# Patient Record
Sex: Male | Born: 1980 | Race: White | Hispanic: No | Marital: Single | Smoking: Former smoker
Health system: Southern US, Community
[De-identification: ages and names within clinical notes are randomized; demographics above are authoritative.]

## PROBLEM LIST (undated history)

## (undated) DIAGNOSIS — F32A Depression, unspecified: Secondary | ICD-10-CM

## (undated) DIAGNOSIS — F419 Anxiety disorder, unspecified: Secondary | ICD-10-CM

## (undated) HISTORY — PX: FOOT FRACTURE SURGERY: SHX645

## (undated) HISTORY — DX: Depression, unspecified: F32.A

## (undated) HISTORY — DX: Anxiety disorder, unspecified: F41.9

---

## 2011-08-28 ENCOUNTER — Emergency Department: Payer: Self-pay | Admitting: Emergency Medicine

## 2011-08-31 ENCOUNTER — Ambulatory Visit: Payer: Self-pay | Admitting: Podiatry

## 2015-07-05 ENCOUNTER — Ambulatory Visit
Admission: EM | Admit: 2015-07-05 | Discharge: 2015-07-05 | Disposition: A | Discharge status: Home / Self Care | Payer: Self-pay | Attending: Family Medicine | Admitting: Family Medicine

## 2015-07-05 ENCOUNTER — Encounter: Payer: Self-pay | Admitting: Emergency Medicine

## 2015-07-05 DIAGNOSIS — Z029 Encounter for administrative examinations, unspecified: Secondary | ICD-10-CM

## 2015-07-05 DIAGNOSIS — Z024 Encounter for examination for driving license: Secondary | ICD-10-CM

## 2015-07-05 LAB — DEPT OF TRANSP DIPSTICK, URINE (ARMC ONLY)
GLUCOSE, UA: NEGATIVE mg/dL
HGB URINE DIPSTICK: NEGATIVE
PROTEIN: NEGATIVE mg/dL
SPECIFIC GRAVITY, URINE: 1.02 (ref 1.005–1.030)

## 2015-07-05 NOTE — ED Notes (Signed)
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  Patient was provided 2 cups of water and still was unable to void at this time.  I spoke to the patient's manager Sonya at [employer] [phone] to verify whether the patient needed a Urine Drug Screen at this visit.  Lamar Laundry stated that "I need for him to be on the road now and if he cannot void for the test, could he come back tomorrow morning for the test?"  She was informed that he could not void at this time after 2 cups of water.  Lamar Laundry stated "I am not concerned about the drug screen and I will need for him to come back tomorrow morning for his DOT Urine Drug Screen."  I notified the patient that Lamar Laundry has asked for him to come back tomorrow to [facility] in the morning for his DOT Urine Drug Screen.  Patient was also instructed to come with a full bladder and that we open at 7am.  Patient was given his DOT Certification paperwork at this time.  Patient verbalized understanding.

## 2015-07-05 NOTE — ED Notes (Signed)
Patient here for DOT Physical.  

## 2015-07-05 NOTE — ED Provider Notes (Signed)
CSN: [id number]     Arrival date & time 07/05/15  0845 History   First MD Initiated Contact with Patient 07/05/15 [phone]     Chief Complaint  Patient presents with  . DOT Physical    (Consider location/radiation/quality/duration/timing/severity/associated sxs/prior Treatment) HPI   34 year old male presents for a DOT physical. 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 He denies any past medical history significant. Surgical past history significant for ORIF of a right fifth metatarsal base fracture History reviewed. No pertinent past medical history. Past Surgical History  Procedure Laterality Date  . Foot fracture surgery Right    History reviewed. No pertinent family history. Social History  Substance Use Topics  . Smoking status: Former Games developer  . Smokeless tobacco: Never Used  . Alcohol Use: Yes    Review of Systems  Constitutional: Negative for chills, activity change and fatigue.  Respiratory: Negative for apnea, chest tightness and shortness of breath.   All other systems reviewed and are negative.   Allergies  Review of patient's allergies indicates no known allergies.  Home Medications   Prior to Admission medications   Not on File   Meds Ordered and Administered this Visit  Medications - No data to display  BP 135/73 mmHg  Pulse 93  Temp(Src) 97.4 F (36.3 C) (Tympanic)  Resp 16  Ht 5' 10.5" (1.791 m)  Wt 179 lb (81.194 kg)  BMI 25.31 kg/m2  SpO2 100% No data found.   Physical Exam  Constitutional:  Referred to DOT physical exam form  Nursing note and vitals reviewed.   ED Course  Procedures (including critical care time)  Labs Review Labs Reviewed  DEPT OF TRANSP DIPSTICK, URINE([facility] ONLY)    Imaging Review No results found.   Visual Acuity Review  Right Eye Distance: 20/20 uncorrected Left Eye Distance: 20/20 uncorrected Bilateral Distance: 20/15 uncorrected  Right Eye Near:    Left Eye Near:    Bilateral Near:         MDM   1. Driver's permit physical examination        Lutricia Feil, PA-C 07/05/15 1013

## 2015-07-06 NOTE — ED Notes (Signed)
Patient returned for DOT COC urine only. Obtained per protocol and specimen to lab with copies of COC to patient and for employer

## 2016-10-31 DIAGNOSIS — Z0000 Encounter for general adult medical examination without abnormal findings: Secondary | ICD-10-CM | POA: Diagnosis not present

## 2017-10-11 ENCOUNTER — Encounter: Payer: Self-pay | Admitting: Emergency Medicine

## 2017-10-11 ENCOUNTER — Emergency Department
Admission: EM | Admit: 2017-10-11 | Discharge: 2017-10-11 | Disposition: A | Discharge status: Home / Self Care | Payer: 59 | Attending: Emergency Medicine | Admitting: Emergency Medicine

## 2017-10-11 ENCOUNTER — Other Ambulatory Visit: Payer: Self-pay

## 2017-10-11 DIAGNOSIS — R55 Syncope and collapse: Secondary | ICD-10-CM | POA: Diagnosis not present

## 2017-10-11 DIAGNOSIS — Z87891 Personal history of nicotine dependence: Secondary | ICD-10-CM | POA: Insufficient documentation

## 2017-10-11 LAB — BASIC METABOLIC PANEL
ANION GAP: 9 (ref 5–15)
BUN: 12 mg/dL (ref 6–20)
CHLORIDE: 101 mmol/L (ref 101–111)
CO2: 27 mmol/L (ref 22–32)
Calcium: 9.4 mg/dL (ref 8.9–10.3)
Creatinine, Ser: 0.93 mg/dL (ref 0.61–1.24)
GFR calc Af Amer: >60 mL/min (ref >60)
GFR calc non Af Amer: >60 mL/min (ref >60)
GLUCOSE: 117 mg/dL — AB (ref 65–99)
POTASSIUM: 3.4 mmol/L — AB (ref 3.5–5.1)
Sodium: 137 mmol/L (ref 135–145)

## 2017-10-11 LAB — CBC
HEMATOCRIT: 43.4 % (ref 40.0–52.0)
HEMOGLOBIN: 14.5 g/dL (ref 13.0–18.0)
MCH: 29.5 pg (ref 26.0–34.0)
MCHC: 33.5 g/dL (ref 32.0–36.0)
MCV: 88 fL (ref 80.0–100.0)
Platelets: 151 10*3/uL (ref 150–440)
RBC: 4.93 MIL/uL (ref 4.40–5.90)
RDW: 13.5 % (ref 11.5–14.5)
WBC: 5.4 10*3/uL (ref 3.8–10.6)

## 2017-10-11 LAB — GLUCOSE, CAPILLARY: Glucose-Capillary: 121 mg/dL — ABNORMAL HIGH (ref 65–99)

## 2017-10-11 NOTE — ED Triage Notes (Signed)
Pt arrives from visiting mother in hospital for LOC. Pt denies hitting head or LOC. Floor checked CBG which was 108 approximately 5 minutes ago. Pt denies use of anticoagulants and is otherwise in NAD.

## 2017-10-11 NOTE — ED Notes (Signed)
Pt in NAD at time of d/c, pt verbalizes d/c understanding and follow up. Pt with family, ambulatory .

## 2017-10-11 NOTE — ED Provider Notes (Signed)
[facility] Emergency Department Provider Note       Time seen: ----------------------------------------- 9:32 PM on 10/11/2017 -----------------------------------------   I have reviewed the triage vital signs and the nursing notes.  HISTORY   Chief Complaint Loss of Consciousness    HPI Brandon Yu is a 36 y.o. male with a history of foot surgery but otherwise healthy who presents to the ED for syncope.  Patient was in the hospital visiting his mother after she had surgery on her leg.  He states he was talking to his mother when he felt dizzy and passed out.  He denies hitting his head, states he sat down forcefully but otherwise has no complaints at this time.  He denies fevers, chills, chest pain, shortness of breath, palpitations, vomiting or diarrhea.  History reviewed. No pertinent past medical history.  There are no active problems to display for this patient.   Past Surgical History:  Procedure Laterality Date  . FOOT FRACTURE SURGERY Right     Allergies Patient has no known allergies.  Social History Social History   Tobacco Use  . Smoking status: Former Games developermoker  . Smokeless tobacco: Never Used  Substance Use Topics  . Alcohol use: Yes  . Drug use: No    Review of Systems Constitutional: Negative for fever. Eyes: Negative for vision changes ENT:  Negative for congestion, sore throat Cardiovascular: Negative for chest pain. Respiratory: Negative for shortness of breath. Gastrointestinal: Negative for abdominal pain, vomiting and diarrhea. Musculoskeletal: Negative for back pain. Skin: Negative for rash. Neurological: Negative for headaches, focal weakness or numbness.  All systems negative/normal/unremarkable except as stated in the HPI  ____________________________________________   PHYSICAL EXAM:  VITAL SIGNS: ED Triage Vitals [10/11/17 2109]  Enc Vitals Group     BP 123/64     Pulse Rate 72     Resp 16     Temp 98.6 F (37 C)     Temp Source Oral     SpO2 100 %     Weight 175 lb (79.4 kg)     Height 6' (1.829 m)     Head Circumference      Peak Flow      Pain Score      Pain Loc      Pain Edu?      Excl. in GC?     Constitutional: Alert and oriented. Well appearing and in no distress. Eyes: Conjunctivae are normal. Normal extraocular movements. ENT   Head: Normocephalic and atraumatic.   Nose: No congestion/rhinnorhea.   Mouth/Throat: Mucous membranes are moist.   Neck: No stridor. Cardiovascular: Normal rate, regular rhythm. No murmurs, rubs, or gallops. Respiratory: Normal respiratory effort without tachypnea nor retractions. Breath sounds are clear and equal bilaterally. No wheezes/rales/rhonchi. Gastrointestinal: Soft and nontender. Normal bowel sounds Musculoskeletal: Nontender with normal range of motion in extremities. No lower extremity tenderness nor edema. Neurologic:  Normal speech and language. No gross focal neurologic deficits are appreciated.  Skin:  Skin is warm, dry and intact. No rash noted. Psychiatric: Mood and affect are normal. Speech and behavior are normal.  ____________________________________________  EKG: Interpreted by me.  Sinus rhythm the rate is 66 bpm, normal PR interval, normal QRS, normal QT.  ____________________________________________  ED COURSE:  As part of my medical decision making, I reviewed the following data within the electronic MEDICAL RECORD NUMBER History obtained from family if available, nursing notes, old chart and ekg, as well as notes from prior ED visits. Patient  presented for syncope or near syncope, we will assess with labs as indicated   Procedures ____________________________________________   LABS (pertinent positives/negatives)  Labs Reviewed  BASIC METABOLIC PANEL - Abnormal; Notable for the following components:      Result Value   Potassium 3.4 (*)    Glucose, Bld 117 (*)    All other components  within normal limits  GLUCOSE, CAPILLARY - Abnormal; Notable for the following components:   Glucose-Capillary 121 (*)    All other components within normal limits  CBC  ____________________________________________  DIFFERENTIAL DIAGNOSIS   Vasovagal syncope, dehydration, electrolyte abnormality, arrhythmia  FINAL ASSESSMENT AND PLAN  Syncope   Plan: Patient had presented for syncope while visiting his mother in the hospital. Patient's labs are unremarkable and he is without complaint at this time.  He did not require any imaging today.  Overall he appears well and stable for outpatient follow-up.   Emily FilbertWilliams, Nichoel Digiulio E, MD   Note: This note was generated in part or whole with voice recognition software. Voice recognition is usually quite accurate but there are transcription errors that can and very often do occur. I apologize for any typographical errors that were not detected and corrected.     Emily FilbertWilliams, Danessa Mensch E, MD 10/11/17 2202

## 2017-10-11 NOTE — ED Triage Notes (Signed)
Pt reports he passed out while he was eating reports he was talking to his mother and felt dizzy and pass out. Pt reports he was visiting his mother in the hospital.  Pt denies hitting his head pt talks in complete sentences denies any other symptoms.

## 2018-08-18 DIAGNOSIS — H11152 Pinguecula, left eye: Secondary | ICD-10-CM | POA: Diagnosis not present

## 2018-08-25 DIAGNOSIS — R6889 Other general symptoms and signs: Secondary | ICD-10-CM | POA: Diagnosis not present

## 2018-08-25 DIAGNOSIS — H1032 Unspecified acute conjunctivitis, left eye: Secondary | ICD-10-CM | POA: Diagnosis not present

## 2018-08-25 DIAGNOSIS — J0100 Acute maxillary sinusitis, unspecified: Secondary | ICD-10-CM | POA: Diagnosis not present

## 2018-09-01 DIAGNOSIS — B349 Viral infection, unspecified: Secondary | ICD-10-CM | POA: Diagnosis not present

## 2020-02-22 ENCOUNTER — Encounter: Payer: Self-pay | Admitting: Family Medicine

## 2020-02-23 ENCOUNTER — Encounter: Payer: Self-pay | Admitting: Family Medicine

## 2020-06-13 ENCOUNTER — Ambulatory Visit: Payer: Self-pay | Attending: Internal Medicine

## 2020-06-13 DIAGNOSIS — Z23 Encounter for immunization: Secondary | ICD-10-CM

## 2020-06-13 NOTE — Progress Notes (Signed)
   Covid-19 Vaccination Clinic  Name:  Brandon Yu    MRN: [id number] DOB: September 02, 1981  06/13/2020  [name] was observed post Covid-19 immunization for 15 minutes without incident. He was provided with Vaccine Information Sheet and instruction to access the V-Safe system.   [name] was instructed to call 911 with any severe reactions post vaccine: Marland Kitchen Difficulty breathing  . Swelling of face and throat  . A fast heartbeat  . A bad rash all over body  . Dizziness and weakness   Immunizations Administered    Name Date Dose VIS Date Route   Moderna COVID-19 Vaccine 06/13/2020  9:22 AM 0.5 mL 09/2019 Intramuscular   Manufacturer: Moderna   Lot: [id number]   NDC: 07680-881-10

## 2020-07-11 ENCOUNTER — Ambulatory Visit: Payer: Self-pay

## 2020-07-11 ENCOUNTER — Ambulatory Visit: Payer: Self-pay | Attending: Critical Care Medicine

## 2020-07-11 DIAGNOSIS — Z23 Encounter for immunization: Secondary | ICD-10-CM

## 2020-07-11 NOTE — Progress Notes (Signed)
   Covid-19 Vaccination Clinic  Name:  Brandon Yu    MRN: [id number] DOB: 25-Aug-1981  07/11/2020  Mr. Woolsey was observed post Covid-19 immunization for 15 minutes without incident. He was provided with Vaccine Information Sheet and instruction to access the V-Safe system.   Mr. Gell was instructed to call 911 with any severe reactions post vaccine: Marland Kitchen Difficulty breathing  . Swelling of face and throat  . A fast heartbeat  . A bad rash all over body  . Dizziness and weakness   Immunizations Administered    Name Date Dose VIS Date Route   Moderna COVID-19 Vaccine 07/11/2020  9:49 AM 0.5 mL 09/2019 Intramuscular   Manufacturer: Gala Murdoch   Lot: [id number]   NDC: 71062-694-85

## 2021-01-26 ENCOUNTER — Other Ambulatory Visit: Payer: Self-pay

## 2021-01-26 ENCOUNTER — Ambulatory Visit (INDEPENDENT_AMBULATORY_CARE_PROVIDER_SITE_OTHER): Payer: BC Managed Care – PPO | Admitting: Podiatry

## 2021-01-26 ENCOUNTER — Encounter: Payer: Self-pay | Admitting: Podiatry

## 2021-01-26 DIAGNOSIS — L989 Disorder of the skin and subcutaneous tissue, unspecified: Secondary | ICD-10-CM | POA: Diagnosis not present

## 2021-01-26 NOTE — Progress Notes (Signed)
  Subjective:  Patient ID: Brandon Yu, male    DOB: 09-12-1981,  MRN: [id number]  Chief Complaint  Patient presents with  . Callouses    Patient presents today for painful callous lesion bottom of right forefoot x 2-3 weeks  "it feel like Im stepping on a nail"    40 y.o. male presents with the above complaint.  Patient presents with complaint of right submetatarsal 4 benign skin lesion.  Patient states is painful to touch.  He states that it hurts with ambulation almost feels like stepping on a nail.  Is sharp shooting in nature.  He has not tried anything for it.  He would like to discuss treatment options for this he has not seen anyone else prior to seeing me.   Review of Systems: Negative except as noted in the HPI. Denies N/V/F/Ch.  No past medical history on file. No current outpatient medications on file.  Social History   Tobacco Use  Smoking Status Former Smoker  Smokeless Tobacco Never Used    No Known Allergies Objective:  There were no vitals filed for this visit. There is no height or weight on file to calculate BMI. Constitutional Well developed. Well nourished.  Vascular Dorsalis pedis pulses palpable bilaterally. Posterior tibial pulses palpable bilaterally. Capillary refill normal to all digits.  No cyanosis or clubbing noted. Pedal hair growth normal.  Neurologic Normal speech. Oriented to person, place, and time. Epicritic sensation to light touch grossly present bilaterally.  Dermatologic  hyperkeratotic lesion with central nucleated core noted to the right submetatarsal 4.  Pain on palpation to the skin lesion.  No pinpoint bleeding noted.  Orthopedic: Normal joint ROM without pain or crepitus bilaterally. No visible deformities. No bony tenderness.   Radiographs: None Assessment:   1. Benign skin lesion    Plan:  Patient was evaluated and treated and all questions answered.  Right submetatarsal 4 benign skin  lesion/porokeratosis --Lesion was debrided today without complications. Hemostasis was achieved and the area was cleaned. Cantharone was applied followed by an occlusive bandage. Post procedure complications were discussed. Monitor for signs or symptoms of infection and directed to call the office mainly should any occur. -I discussed with the patient that he will benefit from aggressive debridement of the lesion followed by application of Cantharone therapy to help destroy the lesion.  Patient states understanding and would like to proceed with Cantharone therapy.  I discussed with him the generally takes about 3 application.  He states understanding  No follow-ups on file.

## 2021-02-09 ENCOUNTER — Ambulatory Visit: Payer: BC Managed Care – PPO | Admitting: Podiatry

## 2022-01-29 ENCOUNTER — Ambulatory Visit
Admission: RE | Admit: 2022-01-29 | Discharge: 2022-01-29 | Disposition: A | Discharge status: Home / Self Care | Payer: BC Managed Care – PPO | Source: Ambulatory Visit | Attending: Internal Medicine | Admitting: Internal Medicine

## 2022-01-29 ENCOUNTER — Other Ambulatory Visit: Payer: Self-pay | Admitting: Internal Medicine

## 2022-01-29 DIAGNOSIS — R200 Anesthesia of skin: Secondary | ICD-10-CM

## 2022-01-29 DIAGNOSIS — M5010 Cervical disc disorder with radiculopathy, unspecified cervical region: Secondary | ICD-10-CM | POA: Diagnosis present

## 2022-02-05 DIAGNOSIS — M545 Low back pain, unspecified: Secondary | ICD-10-CM | POA: Insufficient documentation

## 2022-08-03 ENCOUNTER — Other Ambulatory Visit: Payer: Self-pay | Admitting: Orthopedic Surgery

## 2022-08-03 DIAGNOSIS — M79604 Pain in right leg: Secondary | ICD-10-CM

## 2022-08-03 DIAGNOSIS — M4807 Spinal stenosis, lumbosacral region: Secondary | ICD-10-CM

## 2022-12-25 ENCOUNTER — Other Ambulatory Visit: Payer: Self-pay | Admitting: Family Medicine

## 2022-12-25 DIAGNOSIS — M79604 Pain in right leg: Secondary | ICD-10-CM

## 2022-12-25 DIAGNOSIS — M4807 Spinal stenosis, lumbosacral region: Secondary | ICD-10-CM

## 2023-01-07 ENCOUNTER — Ambulatory Visit
Admission: RE | Admit: 2023-01-07 | Discharge: 2023-01-07 | Disposition: A | Discharge status: Home / Self Care | Payer: BC Managed Care – PPO | Source: Ambulatory Visit | Attending: Family Medicine | Admitting: Family Medicine

## 2023-01-07 DIAGNOSIS — M79604 Pain in right leg: Secondary | ICD-10-CM

## 2023-01-07 DIAGNOSIS — M4807 Spinal stenosis, lumbosacral region: Secondary | ICD-10-CM

## 2023-01-15 IMAGING — CT CT HEAD W/O CM
4 series · 15 of 47 positions shown, 17 images · non-contrast
Comparison: None.

CLINICAL DATA: Headache; blurred vision; numbness and tingling on
rt side extremities; hx of concussioin over 20 years.



[Series 2: axial st head 5.00 ax · axial · 0.36mm/px · z∈[-563,-457]mm · 6 of 32 slices shown, 8 images]
[im 5/32  brain]
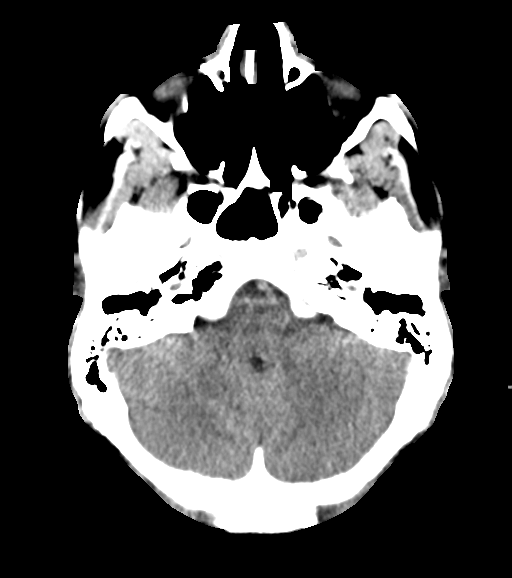
[im 5/32  bone]
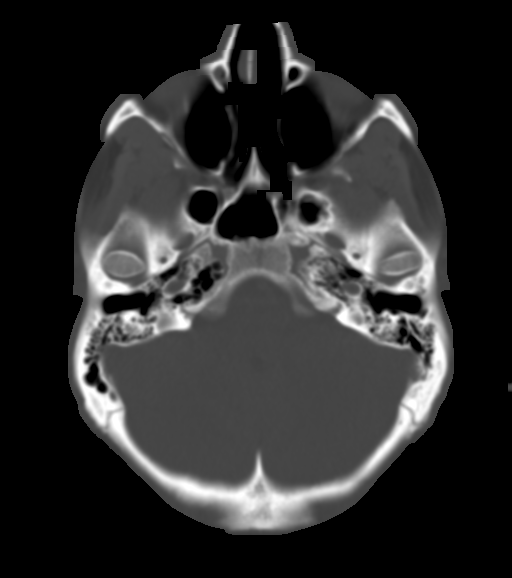
[im 9/32  brain]
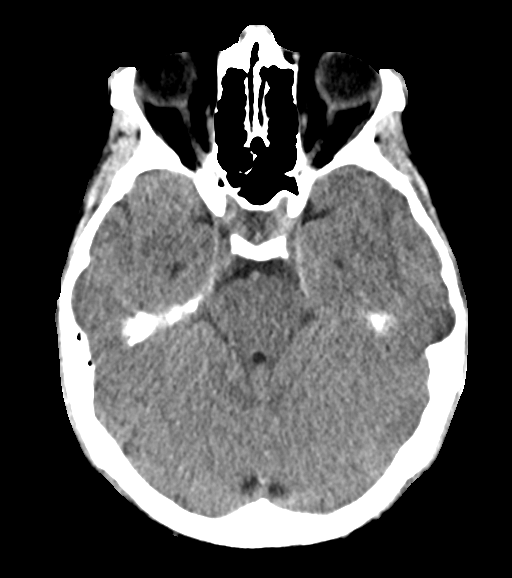
[im 14/32  brain]
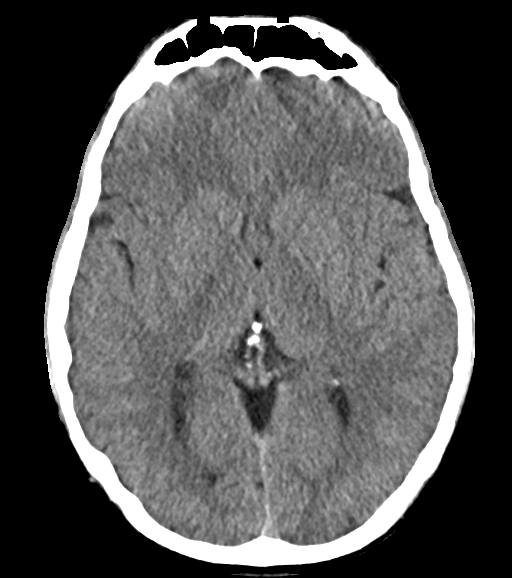
[im 18/32  brain]
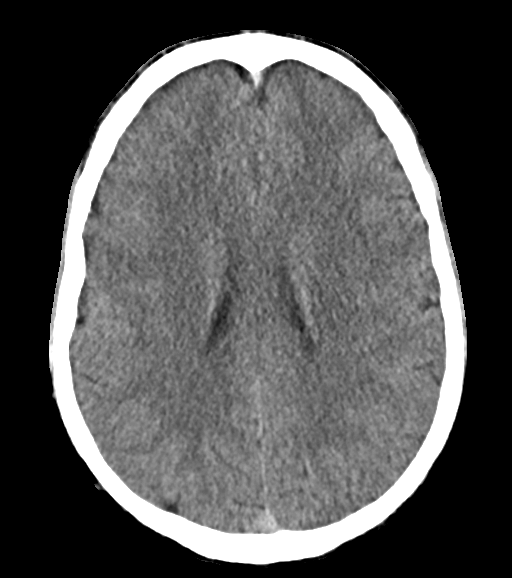
[im 23/32  brain]
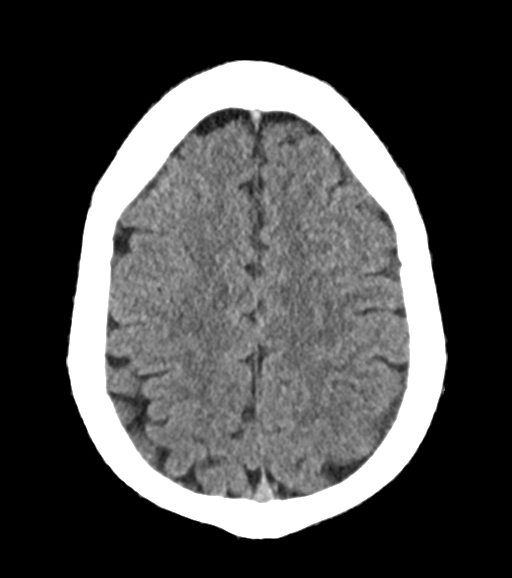
[im 23/32  bone]
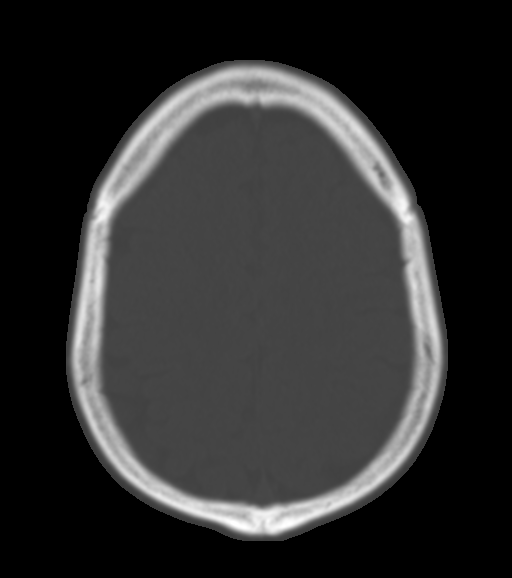
[im 27/32  brain]
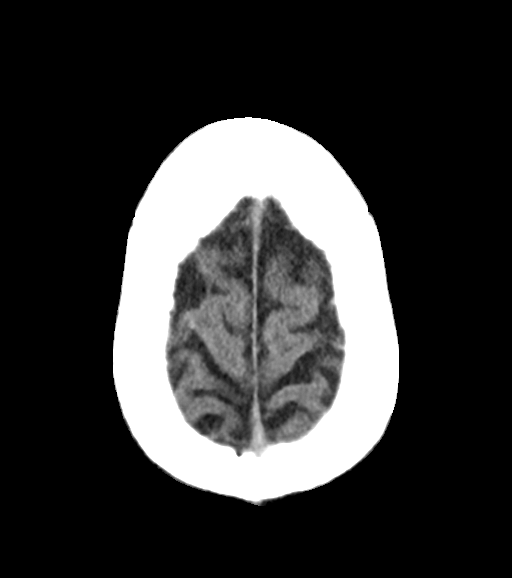

[Series 4: bone windows head 2.00 ax · axial · 0.36mm/px · z∈[-571,-534]mm · 3 of 81 slices shown]
[im 8/81  bone]
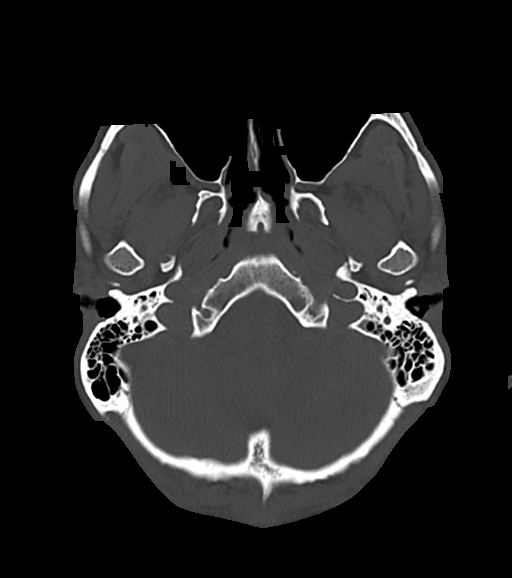
[im 16/81  bone]
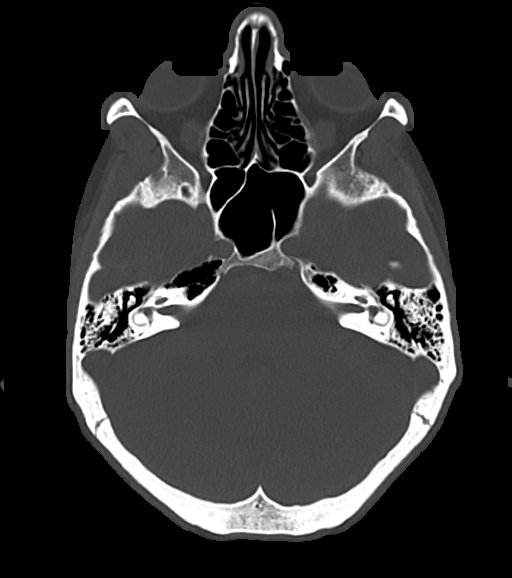
[im 27/81  bone]
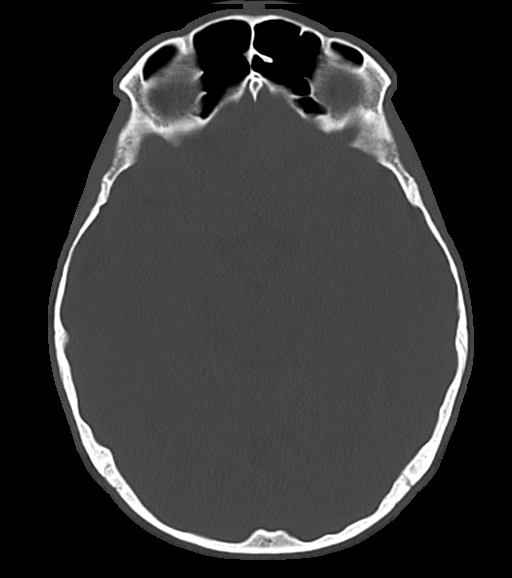

[Series 6: coronals head 3.00 cor · coronal · 0.32mm/px · 3 of 70 slices shown]
[im 24/70  brain]
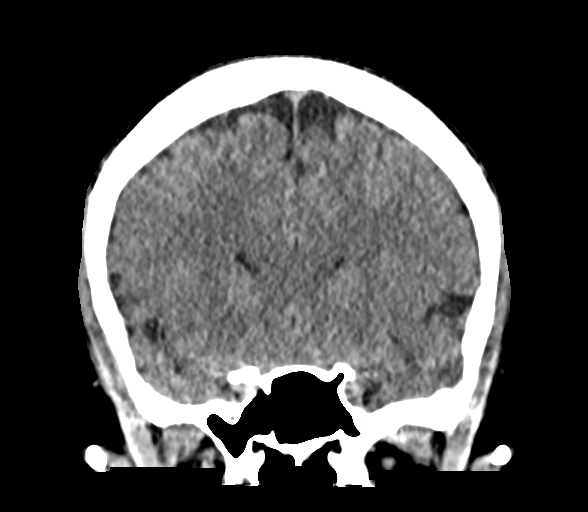
[im 31/70  brain]
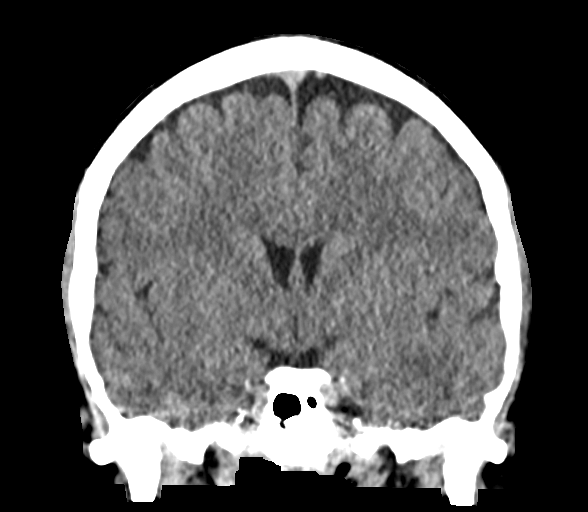
[im 39/70  brain]
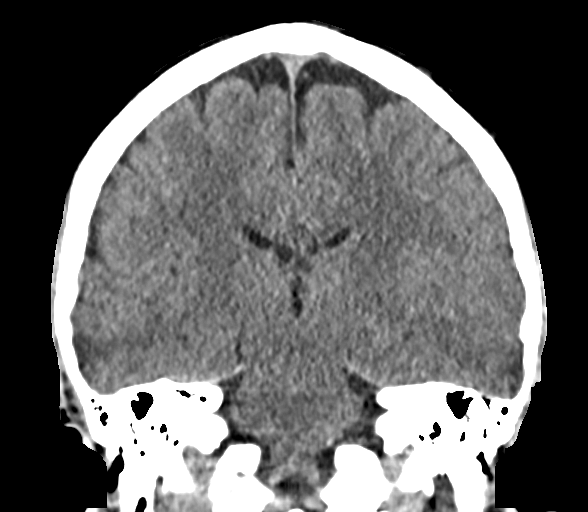

[Series 8: sagittals head 3.00 sag · sagittal · 0.32mm/px · 3 of 62 slices shown]
[im 21/62  brain]
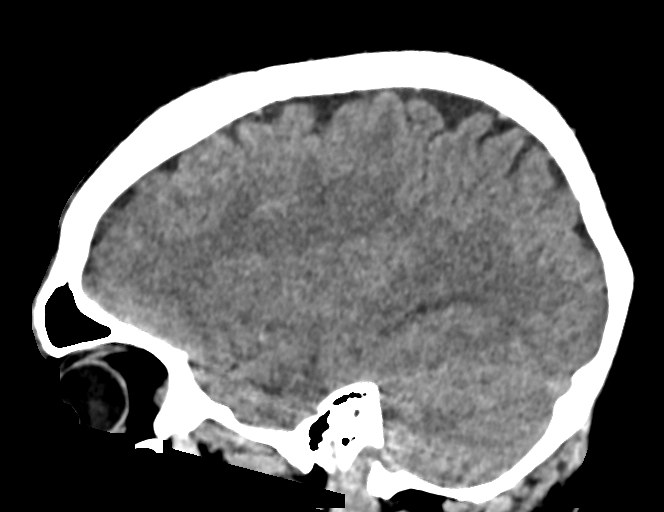
[im 31/62  brain]
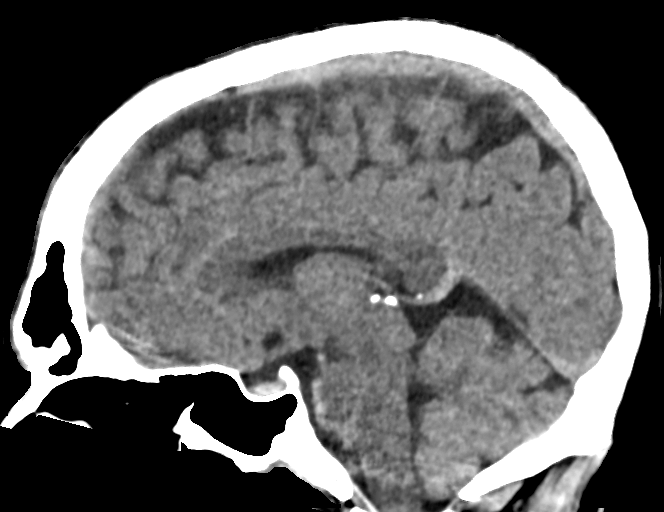
[im 41/62  brain]
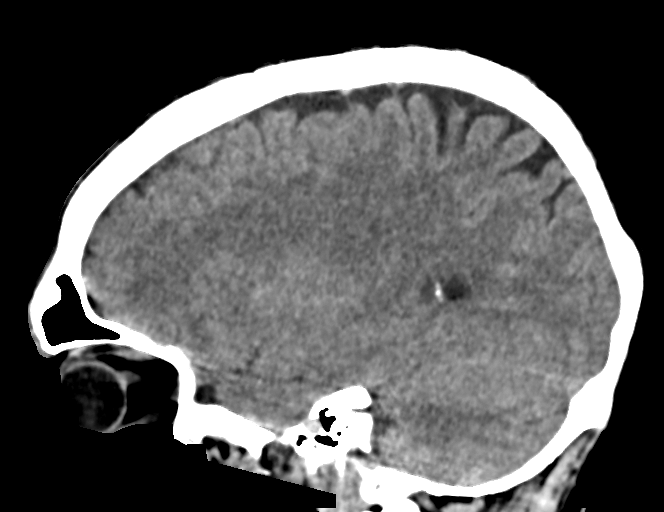

[15 of 47 positions shown; findings below may reference images not displayed]

FINDINGS: Brain: No evidence of acute infarction, hemorrhage, hydrocephalus,
extra-axial collection or mass lesion/mass effect.

Vascular: No hyperdense vessel identified.

Skull: No acute fracture. Small focus of ground-glass density in the
left frontal calvarium, probably fibrous dysplasia.

Sinuses/Orbits: Clear sinuses

Other: No mastoid effusions.
IMPRESSION: No evidence of acute intracranial abnormality.

## 2023-01-17 ENCOUNTER — Ambulatory Visit
Admission: RE | Admit: 2023-01-17 | Discharge: 2023-01-17 | Disposition: A | Discharge status: Home / Self Care | Payer: BC Managed Care – PPO | Source: Ambulatory Visit | Attending: Internal Medicine | Admitting: Internal Medicine

## 2023-01-17 ENCOUNTER — Other Ambulatory Visit: Payer: Self-pay | Admitting: Internal Medicine

## 2023-01-17 DIAGNOSIS — R519 Headache, unspecified: Secondary | ICD-10-CM

## 2023-01-17 DIAGNOSIS — M5116 Intervertebral disc disorders with radiculopathy, lumbar region: Secondary | ICD-10-CM

## 2023-01-17 DIAGNOSIS — R200 Anesthesia of skin: Secondary | ICD-10-CM

## 2023-02-06 ENCOUNTER — Other Ambulatory Visit: Payer: Self-pay | Admitting: Internal Medicine

## 2023-02-06 DIAGNOSIS — R531 Weakness: Secondary | ICD-10-CM

## 2023-02-06 DIAGNOSIS — R519 Headache, unspecified: Secondary | ICD-10-CM

## 2023-02-11 ENCOUNTER — Ambulatory Visit
Admission: RE | Admit: 2023-02-11 | Discharge: 2023-02-11 | Disposition: A | Discharge status: Home / Self Care | Payer: BC Managed Care – PPO | Source: Ambulatory Visit | Attending: Internal Medicine | Admitting: Internal Medicine

## 2023-02-11 DIAGNOSIS — R531 Weakness: Secondary | ICD-10-CM | POA: Diagnosis present

## 2023-02-11 DIAGNOSIS — R519 Headache, unspecified: Secondary | ICD-10-CM | POA: Diagnosis present

## 2023-02-11 MED ORDER — GADOBUTROL 1 MMOL/ML IV SOLN
7.5000 mL | Freq: Once | INTRAVENOUS | Status: AC | PRN
  Administered 2023-02-11: 7.5 mL via INTRAVENOUS

## 2023-04-15 ENCOUNTER — Other Ambulatory Visit: Payer: Self-pay | Admitting: Neurology

## 2023-04-15 DIAGNOSIS — R531 Weakness: Secondary | ICD-10-CM

## 2023-05-08 ENCOUNTER — Encounter: Payer: Self-pay | Admitting: Neurology

## 2023-05-08 ENCOUNTER — Other Ambulatory Visit: Payer: BC Managed Care – PPO

## 2023-05-13 ENCOUNTER — Encounter: Payer: Self-pay | Admitting: Neurology

## 2023-08-21 ENCOUNTER — Other Ambulatory Visit: Payer: Self-pay | Admitting: Neurology

## 2023-08-21 DIAGNOSIS — R200 Anesthesia of skin: Secondary | ICD-10-CM

## 2023-09-05 ENCOUNTER — Ambulatory Visit
Admission: RE | Admit: 2023-09-05 | Discharge: 2023-09-05 | Disposition: A | Discharge status: Home / Self Care | Payer: Medicaid Other | Source: Ambulatory Visit | Attending: Neurology | Admitting: Neurology

## 2023-09-05 DIAGNOSIS — R200 Anesthesia of skin: Secondary | ICD-10-CM

## 2023-09-05 MED ORDER — GADOPICLENOL 0.5 MMOL/ML IV SOLN
10.0000 mL | Freq: Once | INTRAVENOUS | Status: AC | PRN
  Administered 2023-09-05: 10 mL via INTRAVENOUS

## 2024-03-05 ENCOUNTER — Ambulatory Visit: Admitting: Student in an Organized Health Care Education/Training Program

## 2024-03-12 ENCOUNTER — Ambulatory Visit
Admission: RE | Admit: 2024-03-12 | Discharge: 2024-03-12 | Disposition: A | Discharge status: Home / Self Care | Source: Ambulatory Visit | Attending: Student in an Organized Health Care Education/Training Program | Admitting: Student in an Organized Health Care Education/Training Program

## 2024-03-12 ENCOUNTER — Ambulatory Visit: Admission: RE | Admit: 2024-03-12 | Source: Ambulatory Visit

## 2024-03-12 ENCOUNTER — Encounter: Payer: Self-pay | Admitting: Student in an Organized Health Care Education/Training Program

## 2024-03-12 ENCOUNTER — Other Ambulatory Visit: Payer: Self-pay | Admitting: Student in an Organized Health Care Education/Training Program

## 2024-03-12 ENCOUNTER — Ambulatory Visit (HOSPITAL_BASED_OUTPATIENT_CLINIC_OR_DEPARTMENT_OTHER): Admitting: Student in an Organized Health Care Education/Training Program

## 2024-03-12 VITALS — BP 105/73 | HR 78 | Temp 98.9°F | Ht 71.0 in | Wt 215.0 lb

## 2024-03-12 DIAGNOSIS — G894 Chronic pain syndrome: Secondary | ICD-10-CM | POA: Diagnosis present

## 2024-03-12 DIAGNOSIS — G8929 Other chronic pain: Secondary | ICD-10-CM | POA: Diagnosis present

## 2024-03-12 DIAGNOSIS — M79604 Pain in right leg: Secondary | ICD-10-CM | POA: Insufficient documentation

## 2024-03-12 DIAGNOSIS — R52 Pain, unspecified: Secondary | ICD-10-CM | POA: Insufficient documentation

## 2024-03-12 DIAGNOSIS — M79605 Pain in left leg: Secondary | ICD-10-CM | POA: Diagnosis present

## 2024-03-12 DIAGNOSIS — M545 Low back pain, unspecified: Secondary | ICD-10-CM | POA: Diagnosis present

## 2024-03-12 NOTE — Patient Instructions (Signed)
 Consider referral to rheumatology  Consider alpa lipoic acid  I would hold off on SCS at this time

## 2024-03-12 NOTE — Progress Notes (Signed)
 Safety precautions to be maintained throughout the outpatient stay will include: orient to surroundings, keep bed in low position, maintain call bell within reach at all times, provide assistance with transfer out of bed and ambulation.

## 2024-03-12 NOTE — Progress Notes (Signed)
 PROVIDER NOTE: Interpretation of information contained herein should be left to medically-trained personnel. Specific patient instructions are provided elsewhere under "Patient Instructions" section of medical record. This document was created in part using AI and STT-dictation technology, any transcriptional errors that may result from this process are unintentional.  Patient: Brandon Yu  Service: E/M Encounter  Provider: Cephus Collin, MD  DOB: 26-Feb-1981  Delivery: Face-to-face  Specialty: Interventional Pain Management  MRN: [id number]  Setting: Ambulatory outpatient facility  Specialty designation: 09  Type: New Patient  Location: Outpatient office facility  PCP: Brandon Cunning, MD  DOS: 03/12/2024    Referring Prov.: Brandon Neat, MD   Primary Reason(s) for Visit: Encounter for initial evaluation of one or more chronic problems (new to examiner) potentially causing chronic pain, and posing a threat to normal musculoskeletal function. (Level of risk: High) CC: Back Pain (right)  HPI  Brandon Yu is a 43 y.o. year old, male patient, who comes for the first time to our practice referred by Brandon Chafe I, MD for our initial evaluation of his chronic pain. He has Low back pain; Bilateral leg pain; and Chronic pain syndrome on their problem list. Today he comes in for evaluation of his Back Pain (right)  Pain Assessment: Location: Right, Left, Anterior, Upper, Lower, Lateral Back (back, hip,leg, head ,foot on the right side) Radiating: pain radiaities eveywher Onset: More than a month ago Duration: Chronic pain Quality: Constant, Burning, Aching, Dull, Discomfort, Sharp, Shooting, Stabbing, Throbbing, Tightness, Numbness, Heaviness Severity: 7 /10 (subjective, self-reported pain score)  Effect on ADL: limits my daily activities Timing: Constant Modifying factors: nothing BP: 105/73  HR: 78  Onset and Duration: Date of injury: 12/2021 Cause of pain: Unknown Severity:  Getting worse, NAS-11 at its worse: 9/10, NAS-11 at its best: 5/10, NAS-11 now: 7/10, and NAS-11 on the average: 7/10 Timing: none listed Aggravating Factors:  Alleviating Factors: none listed Associated Problems: Day-time cramps, Night-time cramps, Depression, Fatigue, Inability to concentrate, Numbness, Personality changes, Sadness, Spasms, Swelling, Tingling, and Weakness Quality of Pain: Aching, Annoying, Burning, Cramping, Deep, Disabling, Dreadful, Exhausting, Feeling of weight, Nagging, Pressure-like, Pulsating, Sharp, Shooting, Stabbing, Throbbing, Tingling, Tiring, and Uncomfortable Previous Examinations or Tests:    Brandon Yu is being evaluated for possible interventional pain management therapies for the treatment of his chronic pain.   Discussed the use of AI scribe software for clinical note transcription with the patient, who gave verbal consent to proceed.  History of Present Illness   Brandon Yu is a 43 year old male who presents with persistent right-sided hip and lower back pain. He was referred by Brandon Yu for consideration of a spinal cord stimulator due to persistent pain.  He experiences persistent pain on the right side, primarily affecting his hip and lower back, with occasional radiation to the lower back. Previous injections in the lower back provided only temporary relief for a couple of days. He has tried physical therapy in the past without significant improvement.  He describes swelling in his ankles over the last few days, with noticeable color changes and a shiny appearance, particularly on the left side. He feels numbness from the waist down, describing his legs as feeling heavy and difficult to carry, as if they want to 'go through the floor'. No soreness in the calf but significant soreness in the ankle area. An EMG returned normal results, and a neurologist did not find anything out of the ordinary.  His primary care physician has referred him  to a vein specialist to check his circulation due to the swelling and heaviness in his legs. He has had his B12 levels checked, which were found to be low, and he received a B12 injection. His diet includes hamburgers and hot dogs, which may not be sufficient in B12. He does not currently take a multivitamin and acknowledges not hydrating as much as he should.       Meds   Current Outpatient Medications:    escitalopram (LEXAPRO) 5 MG tablet, Take 5 mg by mouth at bedtime., Disp: , Rfl:   Imaging Review    MR CERVICAL SPINE W WO CONTRAST  Narrative CLINICAL DATA:  Provided history: Numbness on right side. Additional history provided by the scanning technologist: the patient also reports right hip weakness, low back pain, stiffness.  EXAM: MRI CERVICAL SPINE WITHOUT AND WITH CONTRAST  TECHNIQUE: Multiplanar and multiecho pulse sequences of the cervical spine, to include the craniocervical junction and cervicothoracic junction, were obtained without and with intravenous contrast.  CONTRAST:  10 mL Vueway  intravenous contrast.  COMPARISON:  Report from cervical spine radiographs 01/29/2022 (images unavailable). Brain MRI 02/11/2023.  FINDINGS: Alignment: Slight grade 1 retrolisthesis at C3-C4 and C4-C5.  Vertebrae: Cervical vertebral body height is maintained. No significant marrow edema or focal worrisome marrow lesion.  Cord: No signal abnormality identified within the cervical spinal cord. No pathologic spinal cord enhancement.  Posterior Fossa, vertebral arteries, paraspinal tissues: No abnormality identified within included portions of the posterior fossa. Flow voids preserved within the imaged cervical vertebral arteries. No paraspinal mass or collection.  Disc levels:  Mild multilevel disc degeneration.  C2-C3: Slight disc bulge. No significant spinal canal or foraminal stenosis.  C3-C4: Slight grade 1 retrolisthesis. Slight disc bulge. No significant spinal  canal or foraminal stenosis.  C4-C5: Slight grade 1 retrolisthesis. Shallow broad-based central disc protrusion. No significant spinal canal or foraminal stenosis.  C5-C6: Shallow disc bulge eccentric to the left. No significant spinal canal or foraminal stenosis.  C6-C7: Shallow broad-based central disc protrusion. No significant spinal canal or foraminal stenosis.  C7-T1: Small central disc protrusion. Mild facet arthrosis on the right. No significant spinal canal or foraminal stenosis.  T1-T2: This level is imaged in the sagittal plane only. A small central disc protrusion mildly effaces the ventral thecal sac. No significant foraminal stenosis.  Multilevel perineural cysts.  IMPRESSION: 1. No lesion identified within the cervical spinal cord. 2. Mild spondylosis at the cervical and visualized upper thoracic levels as outlined within the body of the report. No more than mild spinal canal narrowing. No significant foraminal stenosis. 3. Slight grade 1 retrolisthesis at C3-C4 and C4-C5.   Electronically Signed By: Bascom Lily D.O. On: 09/05/2023 18:00   MR LUMBAR SPINE WO CONTRAST  Narrative CLINICAL DATA:  Right leg pain and discomfort in right hip for 6 months.  EXAM: MRI LUMBAR SPINE WITHOUT CONTRAST  TECHNIQUE: Multiplanar, multisequence MR imaging of the lumbar spine was performed. No intravenous contrast was administered.  COMPARISON:  None Available.  FINDINGS: Segmentation: Standard; the lowest formed disc space is designated L5-S1.  Alignment:  Normal.  Vertebrae: Vertebral body heights are preserved. Background marrow signal is normal. There is no suspicious marrow signal abnormality or marrow edema. Multiple small Schmorl's nodes are noted without adjacent edema.  Conus medullaris and cauda equina: Conus extends to the T12-L1 level. Conus and cauda equina appear normal.  Paraspinal and other soft tissues: Multiple renal cysts are  noted requiring no specific imaging follow-up.  The paraspinal soft tissues are unremarkable.  Disc levels:  The disc heights are overall preserved with mild desiccation at L2-L3.  T12-L1: Unremarkable.  L1-L2: Unremarkable.  L2-L3: There is a minimal disc bulge without significant spinal canal or neural foraminal stenosis  L3-L4: There is a minimal disc bulge and mild left worse than right facet arthropathy without significant spinal canal or neural foraminal stenosis  L4-L5: Mild left facet arthropathy without significant spinal canal or neural foraminal stenosis  L5-S1: Unremarkable.  IMPRESSION: Overall minimal degenerative changes in the lumbar spine as detailed above without significant spinal canal or neural foraminal stenosis. No evidence of nerve root impingement or other finding to explain the patient's pain.   Electronically Signed By: Eldora Greet M.D. On: 01/07/2023 12:04  Complexity Note: Imaging results reviewed.                         ROS  Cardiovascular: High blood pressure Pulmonary or Respiratory: Shortness of breath Neurological: No reported neurological signs or symptoms such as seizures, abnormal skin sensations, urinary and/or fecal incontinence, being born with an abnormal open spine and/or a tethered spinal cord Psychological-Psychiatric: Psychiatric disorder, Anxiousness, and Depressed Gastrointestinal: Reflux or heatburn Genitourinary: No reported renal or genitourinary signs or symptoms such as difficulty voiding or producing urine, peeing blood, non-functioning kidney, kidney stones, difficulty emptying the bladder, difficulty controlling the flow of urine, or chronic kidney disease Hematological: No reported hematological signs or symptoms such as prolonged bleeding, low or poor functioning platelets, bruising or bleeding easily, hereditary bleeding problems, low energy levels due to low hemoglobin or being anemic Endocrine: No reported  endocrine signs or symptoms such as high or low blood sugar, rapid heart rate due to high thyroid levels, obesity or weight gain due to slow thyroid or thyroid disease and na Rheumatologic: No reported rheumatological signs and symptoms such as fatigue, joint pain, tenderness, swelling, redness, heat, stiffness, decreased range of motion, with or without associated rash Musculoskeletal: Negative for myasthenia gravis, muscular dystrophy, multiple sclerosis or malignant hyperthermia Work History: Unemployed  Allergies  Brandon Yu has no known allergies.  Laboratory Chemistry Profile   Renal Lab Results  Component Value Date   BUN 12 10/11/2017   CREATININE 0.93 10/11/2017   GFRAA >60 10/11/2017   GFRNONAA >60 10/11/2017   PROTEINUR NEGATIVE 07/05/2015     Electrolytes Lab Results  Component Value Date   NA 137 10/11/2017   K 3.4 (L) 10/11/2017   CL 101 10/11/2017   CALCIUM 9.4 10/11/2017     Hepatic No results found for: "AST", "ALT", "ALBUMIN", "ALKPHOS", "AMYLASE", "LIPASE", "AMMONIA"   ID No results found for: "LYMEIGGIGMAB", "HIV", "SARSCOV2NAA", "STAPHAUREUS", "MRSAPCR", "HCVAB", "PREGTESTUR", "RMSFIGG", "QFVRPH1IGG", "QFVRPH2IGG"   Bone No results found for: "VD25OH", "VD125OH2TOT", "[id number]", "NW2956OZ3", "25OHVITD1", "25OHVITD2", "25OHVITD3", "TESTOFREE", "TESTOSTERONE"   Endocrine Lab Results  Component Value Date   GLUCOSE 117 (H) 10/11/2017   GLUCOSEU NEGATIVE 07/05/2015     Neuropathy No results found for: "VITAMINB12", "FOLATE", "HGBA1C", "HIV"   CNS No results found for: "COLORCSF", "APPEARCSF", "RBCCOUNTCSF", "WBCCSF", "POLYSCSF", "LYMPHSCSF", "EOSCSF", "PROTEINCSF", "GLUCCSF", "JCVIRUS", "CSFOLI", "IGGCSF", "LABACHR", "ACETBL"   Inflammation (CRP: Acute  ESR: Chronic) No results found for: "CRP", "ESRSEDRATE", "LATICACIDVEN"   Rheumatology No results found for: "RF", "ANA", "LABURIC", "URICUR", "LYMEIGGIGMAB", "LYMEABIGMQN", "HLAB27"    Coagulation Lab Results  Component Value Date   PLT 151 10/11/2017     Cardiovascular Lab Results  Component Value Date   HGB 14.5 10/11/2017  HCT 43.4 10/11/2017     Screening No results found for: "SARSCOV2NAA", "COVIDSOURCE", "STAPHAUREUS", "MRSAPCR", "HCVAB", "HIV", "PREGTESTUR"   Cancer No results found for: "CEA", "CA125", "LABCA2"   Allergens No results found for: "ALMOND", "APPLE", "ASPARAGUS", "AVOCADO", "BANANA", "BARLEY", "BASIL", "BAYLEAF", "GREENBEAN", "LIMABEAN", "WHITEBEAN", "BEEFIGE", "REDBEET", "BLUEBERRY", "BROCCOLI", "CABBAGE", "MELON", "CARROT", "CASEIN", "CASHEWNUT", "CAULIFLOWER", "CELERY"     Note: Lab results reviewed.  PFSH  Drug: Brandon Yu  reports no history of drug use. Alcohol:  reports current alcohol use. Tobacco:  reports that he has quit smoking. He has never used smokeless tobacco. Medical:  has a past medical history of Anxiety and Depression. Family: family history is not on file.  Past Surgical History:  Procedure Laterality Date   FOOT FRACTURE SURGERY Right    Active Ambulatory Problems    Diagnosis Date Noted   Low back pain 02/05/2022   Bilateral leg pain 03/12/2024   Chronic pain syndrome 03/12/2024   Resolved Ambulatory Problems    Diagnosis Date Noted   No Resolved Ambulatory Problems   Past Medical History:  Diagnosis Date   Anxiety    Depression    Constitutional Exam  General appearance: Well nourished, well developed, and well hydrated. In no apparent acute distress Vitals:   03/12/24 0848  BP: 105/73  Pulse: 78  Temp: 98.9 F (37.2 C)  SpO2: 98%  Weight: 215 lb (97.5 kg)  Height: 5\' 11"  (1.803 m)   BMI Assessment: Estimated body mass index is 29.99 kg/m as calculated from the following:   Height as of this encounter: 5\' 11"  (1.803 m).   Weight as of this encounter: 215 lb (97.5 kg).  BMI interpretation table: BMI level Category Range association with higher incidence of chronic pain  <18 kg/m2  Underweight   18.5-24.9 kg/m2 Ideal body weight   25-29.9 kg/m2 Overweight Increased incidence by 20%  30-34.9 kg/m2 Obese (Class Yu) Increased incidence by 68%  35-39.9 kg/m2 Severe obesity (Class II) Increased incidence by 136%  >40 kg/m2 Extreme obesity (Class III) Increased incidence by 254%   Patient's current BMI Ideal Body weight  Body mass index is 29.99 kg/m. Ideal body weight: 75.3 kg (166 lb 0.1 oz) Adjusted ideal body weight: 84.2 kg (185 lb 9.7 oz)   BMI Readings from Last 4 Encounters:  03/12/24 29.99 kg/m  10/11/17 23.73 kg/m  07/05/15 25.32 kg/m   Wt Readings from Last 4 Encounters:  03/12/24 215 lb (97.5 kg)  10/11/17 175 lb (79.4 kg)  07/05/15 179 lb (81.2 kg)    Psych/Mental status: Alert, oriented x 3 (person, place, & time)       Eyes: PERLA Respiratory: No evidence of acute respiratory distress  Lumbar Spine Area Exam  Skin & Axial Inspection: No masses, redness, or swelling Alignment: Symmetrical Functional ROM: Unrestricted ROM       Stability: No instability detected Muscle Tone/Strength: Functionally intact. No obvious neuro-muscular anomalies detected. Sensory (Neurological): Unimpaired  Gait & Posture Assessment  Ambulation: Unassisted Gait: Relatively normal for age and body habitus Posture: WNL  Lower Extremity Exam    Side: Right lower extremity  Side: Left lower extremity  Stability: No instability observed          Stability: No instability observed          Skin & Extremity Inspection: Skin color, temperature, and hair growth are WNL. No peripheral edema or cyanosis. No masses, redness, swelling, asymmetry, or associated skin lesions. No contractures.  Skin & Extremity Inspection: Skin color, temperature, and  hair growth are WNL. No peripheral edema or cyanosis. No masses, redness, swelling, asymmetry, or associated skin lesions. No contractures.  Functional ROM: Pain restricted ROM for hip and knee joints          Functional ROM: Pain  restricted ROM for hip and knee joints          Muscle Tone/Strength: Functionally intact. No obvious neuro-muscular anomalies detected.  Muscle Tone/Strength: Functionally intact. No obvious neuro-muscular anomalies detected.  Sensory (Neurological): Neurogenic pain pattern        Sensory (Neurological): Neurogenic pain pattern        DTR: Patellar: deferred today Achilles: deferred today Plantar: deferred today  DTR: Patellar: deferred today Achilles: deferred today Plantar: deferred today  Palpation: No palpable anomalies  Palpation: No palpable anomalies    Assessment  Primary Diagnosis & Pertinent Problem List: The primary encounter diagnosis was Chronic bilateral low back pain without sciatica. Diagnoses of Bilateral leg pain and Chronic pain syndrome were also pertinent to this visit.  Visit Diagnosis (New problems to examiner): 1. Chronic bilateral low back pain without sciatica   2. Bilateral leg pain   3. Chronic pain syndrome    Plan of Care (Initial workup plan)   Assessment and Plan    Low back pain   Chronic low back pain radiates to the right hip and lower back. Previous L-TFESI injections provided only short-term relief. MRI shows no significant nerve compression or disc herniations, and EMG is normal. He is not a candidate for a spinal cord stimulator due to the lack of targetable lesions, and the risks outweigh the benefits. Hold off on spinal cord stimulator. Consider acupuncture or massage for pain relief. Follow up with primary care physician for further evaluation.  Right hip pain   Chronic right hip pain is associated with low back pain. MRI and EMG show no significant findings, and pain is not attributed to nerve compression or peripheral nerve damage. Consider acupuncture or massage for pain relief. Follow up with primary care physician for further evaluation.  Ankle swelling   Recent onset of ankle swelling with shiny skin, more pronounced on the left side,  suggests a possible rheumatologic condition.  Consider autoimmune or rheumatologic conditions due to presentation. Referral to rheumatologist. Seek further evaluation if swelling persists or worsens.  Start multivitamin. Increase hydration. Consider alpha lipoic acid for neuropathy symptoms.   Yu did discuss SCS with pt  Yu explained to pt how a spinal cord stimulation (SCS) is indicated for chronic, intractable pain, especially in cases of failed back surgery syndrome, radiculopathy, or pain syndromes that affect broader areas such as the back and legs.  SCS devices provide continuous electrical impulses to the spinal cord, modulating pain signals before they reach the brain.  We discussed the potential benefits and risks of spinal cord stimulation. Benefits: can provide substantial relief from chronic pain, long-term therapy with programmable settings, often reduces the need for oral medications, including opioids, some patients experience reduced dependency on other pain interventions. Risks (including but not limited to): Surgical risks, including infection, lead migration/fracture, and hardware malfunction, long-term implantation requires ongoing maintenance and possible reoperation, variable effectiveness: some patients do not experience sufficient pain relief.        Provider-requested follow-up: No follow-ups on file.  No future appointments.  Yu discussed the assessment and treatment plan with the patient. The patient was provided an opportunity to ask questions and all were answered. The patient agreed with the plan and demonstrated an understanding  of the instructions.  Patient advised to call back or seek an in-person evaluation if the symptoms or condition worsens.  Duration of encounter: .  Total time on encounter, as per AMA guidelines included both the face-to-face and non-face-to-face time personally spent by the physician and/or other qualified health care professional(s)  on the day of the encounter (includes time in activities that require the physician or other qualified health care professional and does not include time in activities normally performed by clinical staff). Physician's time may include the following activities when performed: Preparing to see the patient (e.g., pre-charting review of records, searching for previously ordered imaging, lab work, and nerve conduction tests) Review of prior analgesic pharmacotherapies. Reviewing PMP Interpreting ordered tests (e.g., lab work, imaging, nerve conduction tests) Performing post-procedure evaluations, including interpretation of diagnostic procedures Obtaining and/or reviewing separately obtained history Performing a medically appropriate examination and/or evaluation Counseling and educating the patient/family/caregiver Ordering medications, tests, or procedures Referring and communicating with other health care professionals (when not separately reported) Documenting clinical information in the electronic or other health record Independently interpreting results (not separately reported) and communicating results to the patient/ family/caregiver Care coordination (not separately reported)  Note by: Brandon Collin, MD (TTS and AI technology used. Yu apologize for any typographical errors that were not detected and corrected.) Date: 03/12/2024; Time: 10:40 AM

## 2024-03-15 ENCOUNTER — Other Ambulatory Visit
Admission: RE | Admit: 2024-03-15 | Discharge: 2024-03-15 | Disposition: A | Discharge status: Home / Self Care | Source: Ambulatory Visit | Attending: Family Medicine | Admitting: Family Medicine

## 2024-03-15 DIAGNOSIS — M7989 Other specified soft tissue disorders: Secondary | ICD-10-CM | POA: Diagnosis present

## 2024-03-15 DIAGNOSIS — L309 Dermatitis, unspecified: Secondary | ICD-10-CM | POA: Insufficient documentation

## 2024-03-15 DIAGNOSIS — R600 Localized edema: Secondary | ICD-10-CM | POA: Diagnosis present

## 2024-03-15 LAB — BASIC METABOLIC PANEL WITH GFR
Anion gap: 9 (ref 5–15)
BUN: 8 mg/dL (ref 6–20)
CO2: 27 mmol/L (ref 22–32)
Calcium: 8.9 mg/dL (ref 8.9–10.3)
Chloride: 98 mmol/L (ref 98–111)
Creatinine, Ser: 0.9 mg/dL (ref 0.61–1.24)
GFR, Estimated: >60 mL/min (ref >60)
Glucose, Bld: 87 mg/dL (ref 70–99)
Potassium: 3.8 mmol/L (ref 3.5–5.1)
Sodium: 134 mmol/L — ABNORMAL LOW (ref 135–145)

## 2024-03-15 LAB — D-DIMER, QUANTITATIVE: D-Dimer, Quant: 0.59 ug{FEU}/mL — ABNORMAL HIGH (ref 0.00–0.50)

## 2024-03-16 ENCOUNTER — Emergency Department

## 2024-03-16 ENCOUNTER — Encounter: Payer: Self-pay | Admitting: Emergency Medicine

## 2024-03-16 ENCOUNTER — Other Ambulatory Visit: Payer: Self-pay

## 2024-03-16 ENCOUNTER — Emergency Department
Admission: EM | Admit: 2024-03-16 | Discharge: 2024-03-16 | Disposition: A | Discharge status: Home / Self Care | Attending: Emergency Medicine | Admitting: Emergency Medicine

## 2024-03-16 DIAGNOSIS — R600 Localized edema: Secondary | ICD-10-CM | POA: Diagnosis not present

## 2024-03-16 DIAGNOSIS — R0602 Shortness of breath: Secondary | ICD-10-CM | POA: Insufficient documentation

## 2024-03-16 DIAGNOSIS — R791 Abnormal coagulation profile: Secondary | ICD-10-CM | POA: Diagnosis not present

## 2024-03-16 DIAGNOSIS — M7989 Other specified soft tissue disorders: Secondary | ICD-10-CM | POA: Diagnosis present

## 2024-03-16 DIAGNOSIS — Z87891 Personal history of nicotine dependence: Secondary | ICD-10-CM | POA: Insufficient documentation

## 2024-03-16 LAB — CBC
HCT: 45.2 % (ref 39.0–52.0)
Hemoglobin: 15.6 g/dL (ref 13.0–17.0)
MCH: 30.6 pg (ref 26.0–34.0)
MCHC: 34.5 g/dL (ref 30.0–36.0)
MCV: 88.8 fL (ref 80.0–100.0)
Platelets: 225 10*3/uL (ref 150–400)
RBC: 5.09 MIL/uL (ref 4.22–5.81)
RDW: 12.1 % (ref 11.5–15.5)
WBC: 10.2 10*3/uL (ref 4.0–10.5)
nRBC: 0 % (ref 0.0–0.2)

## 2024-03-16 LAB — BASIC METABOLIC PANEL WITH GFR
Anion gap: 11 (ref 5–15)
BUN: 9 mg/dL (ref 6–20)
CO2: 28 mmol/L (ref 22–32)
Calcium: 8.6 mg/dL — ABNORMAL LOW (ref 8.9–10.3)
Chloride: 99 mmol/L (ref 98–111)
Creatinine, Ser: 0.98 mg/dL (ref 0.61–1.24)
GFR, Estimated: >60 mL/min (ref >60)
Glucose, Bld: 96 mg/dL (ref 70–99)
Potassium: 3.2 mmol/L — ABNORMAL LOW (ref 3.5–5.1)
Sodium: 138 mmol/L (ref 135–145)

## 2024-03-16 LAB — TROPONIN I (HIGH SENSITIVITY): Troponin I (High Sensitivity): 3 ng/L (ref <18)

## 2024-03-16 LAB — BRAIN NATRIURETIC PEPTIDE: B Natriuretic Peptide: 44.5 pg/mL (ref 0.0–100.0)

## 2024-03-16 MED ORDER — IOHEXOL 350 MG/ML SOLN
75.0000 mL | Freq: Once | INTRAVENOUS | Status: AC | PRN
  Administered 2024-03-16: 75 mL via INTRAVENOUS

## 2024-03-16 NOTE — ED Triage Notes (Signed)
 Pt via POV from home. Pt c/o abnormal labs. Reports he was seen at his PCP for bilateral feet swelling. States minimal SOB. States blood work was done yesterday and they stated his D-Dimer was elevated. States he was given medication to get the swelling down and it has helped. Pt is A&Ox4 and NAD, ambulatory to triage

## 2024-03-16 NOTE — ED Notes (Signed)
 Lt green, lav, blue top sent to lab at this time.

## 2024-03-16 NOTE — ED Provider Notes (Signed)
 [facility] Provider Note    Event Date/Time   First MD Initiated Contact with Patient 03/16/24 1133     (approximate)   History   Abnormal Lab   HPI Brandon Yu is a 43 y.o. male presenting today for shortness of breath and abnormal labs.  Patient was seen by his primary care provider yesterday for swelling to his lower extremities.  Not on any blood pressure medications.  No history of blood clots.  He had a D-dimer obtained at that time which was found to be elevated and he was called back to the ED for further evaluation.  Also notes intermittent shortness of breath.  Does have smoking history and vaping history.  Drinks 4 beers per day.  Denies chest pain or abdominal pain.  No nausea or vomiting.  He was started on cefdinir, Lasix, and prednisone yesterday.  He had noted improvement in swelling this morning.     Physical Exam   Triage Vital Signs: ED Triage Vitals  Encounter Vitals Group     BP 03/16/24 1125 (!) 167/103     Systolic BP Percentile --      Diastolic BP Percentile --      Pulse Rate 03/16/24 1125 78     Resp 03/16/24 1125 18     Temp 03/16/24 1125 98.6 F (37 C)     Temp Source 03/16/24 1125 Oral     SpO2 03/16/24 1125 97 %     Weight 03/16/24 1124 220 lb (99.8 kg)     Height 03/16/24 1124 5\' 11"  (1.803 m)     Head Circumference --      Peak Flow --      Pain Score 03/16/24 1124 0     Pain Loc --      Pain Education --      Exclude from Growth Chart --     Most recent vital signs: Vitals:   03/16/24 1125  BP: (!) 167/103  Pulse: 78  Resp: 18  Temp: 98.6 F (37 C)  SpO2: 97%   I have reviewed the vital signs. General:  Awake, alert, no acute distress. Head:  Normocephalic, Atraumatic. EENT:  PERRL, EOMI, Oral mucosa pink and moist, Neck is supple. Cardiovascular: Regular rate, 2+ distal pulses. Respiratory:  Normal respiratory effort, symmetrical expansion, no distress.   Extremities:  Moving all four  extremities through full ROM without pain.  Swelling noted around ankles bilaterally which is worse on the left side with slight surrounding erythema present.  No tenderness palpation to the calves. Neuro:  Alert and oriented.  Interacting appropriately.   Skin:  Warm, dry, no rash.   Psych: Appropriate affect.    ED Results / Procedures / Treatments   Labs (all labs ordered are listed, but only abnormal results are displayed) Labs Reviewed  BASIC METABOLIC PANEL WITH GFR - Abnormal; Notable for the following components:      Result Value   Potassium 3.2 (*)    Calcium 8.6 (*)    All other components within normal limits  CBC  BRAIN NATRIURETIC PEPTIDE  TROPONIN I (HIGH SENSITIVITY)     EKG My EKG interpretation: Rate of 73, normal sinus rhythm.  Incomplete right bundle branch block.  No acute ST elevations or depressions   RADIOLOGY Independently interpreted CTA chest and bilateral lower extremity ultrasounds with no evidence of blood clots   PROCEDURES:  Critical Care performed: No  Procedures   MEDICATIONS ORDERED IN ED: Medications  iohexol (OMNIPAQUE) 350 MG/ML injection 75 mL (75 mLs Intravenous Contrast Given 03/16/24 1352)     IMPRESSION / MDM / ASSESSMENT AND PLAN / ED COURSE  I reviewed the triage vital signs and the nursing notes.                              Differential diagnosis includes, but is not limited to, PE, DVT, cellulitis, CHF, dependent edema  Patient's presentation is most consistent with acute complicated illness / injury requiring diagnostic workup.  Patient is a 43 year old male presenting today for abnormal labs.  He has had leg swelling to bilateral ankles for the past 3 to 4 days.  Started on cefdinir, Lasix, and prednisone yesterday by his PCP but was called back today when he had elevated D-dimer.  Given leg swelling as well as shortness of breath with elevated D-dimer, we will get CTA chest as well as bilateral venous duplex exams  to rule out DVT or PE.  Dimer may also be elevated in the setting of infection with swelling decreasing after antibiotics and fluid pills.  Will also get BNP to evaluate for heart failure.  Vital signs otherwise stable on exam elsewise from the swelling unremarkable.  CTA chest and bilateral lower extremity ultrasound showed no evidence of blood clots.  BNP normal.  Otherwise CMP, troponin, BNP largely reassuring outside mild hypokalemia.  Recommended patient continue of the medications prescribed to him yesterday to follow-up with his PCP for resolution.  Given strict return precautions.     FINAL CLINICAL IMPRESSION(S) / ED DIAGNOSES   Final diagnoses:  Bilateral leg edema     Rx / DC Orders   ED Discharge Orders     None        Note:  This document was prepared using Dragon voice recognition software and may include unintentional dictation errors.   Kandee Orion, MD 03/16/24 [phone]

## 2024-03-16 NOTE — Discharge Instructions (Addendum)
 Please continue taking all the medications you were given as prescribed as they are helping.  No issues with blood clots or heart failure seen on exam today.  Follow-up with your primary care provider to ensure resolution.

## 2024-03-23 ENCOUNTER — Other Ambulatory Visit: Payer: Self-pay | Admitting: Orthopedic Surgery

## 2024-03-23 DIAGNOSIS — M25551 Pain in right hip: Secondary | ICD-10-CM

## 2024-03-23 DIAGNOSIS — M1611 Unilateral primary osteoarthritis, right hip: Secondary | ICD-10-CM

## 2024-03-26 ENCOUNTER — Ambulatory Visit
Admission: RE | Admit: 2024-03-26 | Discharge: 2024-03-26 | Disposition: A | Discharge status: Home / Self Care | Source: Ambulatory Visit | Attending: Orthopedic Surgery | Admitting: Orthopedic Surgery

## 2024-03-26 DIAGNOSIS — M25551 Pain in right hip: Secondary | ICD-10-CM | POA: Insufficient documentation

## 2024-03-26 DIAGNOSIS — M1611 Unilateral primary osteoarthritis, right hip: Secondary | ICD-10-CM | POA: Diagnosis present
